# Patient Record
Sex: Female | Born: 1995 | Hispanic: Yes | Marital: Single | Smoking: Never smoker
Health system: Southern US, Community
[De-identification: ages and names within clinical notes are randomized; demographics above are authoritative.]

## PROBLEM LIST (undated history)

## (undated) DIAGNOSIS — E119 Type 2 diabetes mellitus without complications: Secondary | ICD-10-CM

## (undated) HISTORY — DX: Type 2 diabetes mellitus without complications: E11.9

## (undated) HISTORY — PX: FRACTURE SURGERY: SHX138

---
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
## 2014-05-30 ENCOUNTER — Inpatient Hospital Stay: Payer: Self-pay

## 2014-05-30 LAB — CBC WITH DIFFERENTIAL/PLATELET
BASOS ABS: 0 10*3/uL (ref 0.0–0.1)
BASOS PCT: 0.3 %
EOS PCT: 0.5 %
Eosinophil #: 0.1 10*3/uL (ref 0.0–0.7)
HCT: 31.7 % — ABNORMAL LOW (ref 35.0–47.0)
HGB: 10 g/dL — AB (ref 12.0–16.0)
LYMPHS ABS: 2.2 10*3/uL (ref 1.0–3.6)
Lymphocyte %: 16.1 %
MCH: 23.8 pg — AB (ref 26.0–34.0)
MCHC: 31.4 g/dL — ABNORMAL LOW (ref 32.0–36.0)
MCV: 76 fL — ABNORMAL LOW (ref 80–100)
MONO ABS: 0.7 x10 3/mm (ref 0.2–0.9)
Monocyte %: 5.2 %
Neutrophil #: 10.4 10*3/uL — ABNORMAL HIGH (ref 1.4–6.5)
Neutrophil %: 77.9 %
Platelet: 281 10*3/uL (ref 150–440)
RBC: 4.18 10*6/uL (ref 3.80–5.20)
RDW: 17.1 % — ABNORMAL HIGH (ref 11.5–14.5)
WBC: 13.4 10*3/uL — ABNORMAL HIGH (ref 3.6–11.0)

## 2014-05-31 LAB — GC/CHLAMYDIA PROBE AMP

## 2014-06-01 LAB — HEMATOCRIT: HCT: 28.7 % — AB (ref 35.0–47.0)

## 2014-09-09 NOTE — H&P (Signed)
L&D Evaluation:  History:  HPI 19 y/o G2P1001 @ 40/3wks EDC 05/27/14. Here with c/o strong contractions beginning this evening, denies leaking fluid or vaginal bleeding, baby is active. Care @ [facility] well pregnancy. GBS negative.   Presents with contractions   Patient's Medical History No Chronic Illness  anemia   Patient's Surgical History none   Medications Pre Natal Vitamins   Allergies NKDA   Social History none   Family History Non-Contributory   ROS:  ROS All systems were reviewed.  HEENT, CNS, GI, GU, Respiratory, CV, Renal and Musculoskeletal systems were found to be normal.   Exam:  Vital Signs stable   Urine Protein not completed   General no apparent distress   Mental Status clear   Chest clear   Heart normal sinus rhythm   Abdomen gravid, non-tender   Estimated Fetal Weight Average for gestational age   Fetal Position vtx   Fundal Height term   Back no CVAT   Edema no edema   Reflexes 1+   Clonus negative   Pelvic no external lesions, 8cm BBOW small show   Mebranes Intact   FHT normal rate with no decels, baseline 130's 140's avg variability with accels   Fetal Heart Rate 136   Ucx regular, Q 2 mins 60 sec strong   Skin dry   Lymph no lymphadenopathy   Impression:  Impression active labor   Plan:  Plan monitor contractions and for cervical change   Comments Admitted, knows what to expect 2nd baby. Plans natural childbirth, breathing through [facility].   Electronic Signatures: Albertina ParrLugiano, Franziska Podgurski B (CNM)  (Signed 29-Jan-16 22:19)  Authored: L&D Evaluation   Last Updated: 29-Jan-16 22:19 by Albertina ParrLugiano, Filimon Miranda B (CNM)

## 2017-07-07 LAB — HM HIV SCREENING LAB: HM HIV Screening: NEGATIVE

## 2017-07-07 LAB — HM PAP SMEAR: HM Pap smear: NEGATIVE

## 2017-11-12 ENCOUNTER — Emergency Department
Admission: EM | Admit: 2017-11-12 | Discharge: 2017-11-12 | Disposition: A | Discharge status: Home / Self Care | Payer: Self-pay | Attending: Emergency Medicine | Admitting: Emergency Medicine

## 2017-11-12 ENCOUNTER — Other Ambulatory Visit: Payer: Self-pay

## 2017-11-12 DIAGNOSIS — A6009 Herpesviral infection of other urogenital tract: Secondary | ICD-10-CM

## 2017-11-12 DIAGNOSIS — A6000 Herpesviral infection of urogenital system, unspecified: Secondary | ICD-10-CM | POA: Insufficient documentation

## 2017-11-12 DIAGNOSIS — N760 Acute vaginitis: Secondary | ICD-10-CM | POA: Insufficient documentation

## 2017-11-12 DIAGNOSIS — B9689 Other specified bacterial agents as the cause of diseases classified elsewhere: Secondary | ICD-10-CM

## 2017-11-12 DIAGNOSIS — E119 Type 2 diabetes mellitus without complications: Secondary | ICD-10-CM | POA: Insufficient documentation

## 2017-11-12 DIAGNOSIS — Z7984 Long term (current) use of oral hypoglycemic drugs: Secondary | ICD-10-CM | POA: Insufficient documentation

## 2017-11-12 LAB — WET PREP, GENITAL
Sperm: NONE SEEN
Trich, Wet Prep: NONE SEEN
Yeast Wet Prep HPF POC: NONE SEEN

## 2017-11-12 LAB — URINALYSIS, ROUTINE W REFLEX MICROSCOPIC
BACTERIA UA: NONE SEEN
BILIRUBIN URINE: NEGATIVE
Glucose, UA: 150 mg/dL — AB
Hgb urine dipstick: NEGATIVE
Ketones, ur: 5 mg/dL — AB
Nitrite: NEGATIVE
Protein, ur: 30 mg/dL — AB
SPECIFIC GRAVITY, URINE: 1.023 (ref 1.005–1.030)
pH: 5 (ref 5.0–8.0)

## 2017-11-12 LAB — CHLAMYDIA/NGC RT PCR (ARMC ONLY)
Chlamydia Tr: NOT DETECTED
N GONORRHOEAE: NOT DETECTED

## 2017-11-12 LAB — GLUCOSE, CAPILLARY: Glucose-Capillary: 235 mg/dL — ABNORMAL HIGH (ref 70–99)

## 2017-11-12 LAB — POCT PREGNANCY, URINE: PREG TEST UR: NEGATIVE

## 2017-11-12 MED ORDER — METFORMIN HCL 500 MG PO TABS: 500.0000 mg | ORAL_TABLET | Freq: Two times a day (BID) | ORAL | 1 refills | Status: AC

## 2017-11-12 MED ORDER — HYDROCODONE-ACETAMINOPHEN 7.5-325 MG/15ML PO SOLN: 15.0000 mL | Freq: Four times a day (QID) | ORAL | 0 refills | Status: AC | PRN

## 2017-11-12 MED ORDER — METRONIDAZOLE 500 MG PO TABS: 500.0000 mg | ORAL_TABLET | Freq: Two times a day (BID) | ORAL | 0 refills | Status: DC

## 2017-11-12 MED ORDER — ACYCLOVIR 400 MG PO TABS: 400.0000 mg | ORAL_TABLET | Freq: Every day | ORAL | 0 refills | Status: AC

## 2017-11-12 NOTE — Discharge Instructions (Signed)
Call 1 of the clinics listed on your discharge papers to set up an appointment for primary care.  Today's testing shows that you have diabetes type 2.  Begin taking metformin 500 mg twice a day.  1 refill is added to your prescription.  Also Flagyl 500 mg twice daily for 7 days for your bacterial vaginosis.  Hydrocodone is as needed for pain.  Take only as directed.  You need to begin Zovirax tablets 5 times per day for the next 10 days.  This medication is for your herpes.  May also follow-up with the health department for any further STD testing.  You also get a phone call today if your gonorrhea and Chlamydia test are positive. Take the yellow card with you when you get your prescriptions filled so that you may get a discount. Began a low sugar diet until you are able to see a primary care provider.

## 2017-11-12 NOTE — ED Notes (Signed)
See triage note  States she developed some vaginal itching on weds with some dysuria  No fever or n/v  Also states she noticed some sores in vaginal area

## 2017-11-12 NOTE — ED Notes (Signed)
First Nurse Note: pt c/o vaginal burning. Pt in NAD

## 2017-11-12 NOTE — ED Provider Notes (Signed)
[facility] Emergency Department Provider Note  ____________________________________________   First MD Initiated Contact with Patient 11/12/17 0801     (approximate)  I have reviewed the triage vital signs and the nursing notes.   HISTORY  Chief Complaint Dysuria and Vaginal Itching  HPI Jennifer Murillo is a 22 y.o. female is here with complaint of vaginal itching and burning for the last 4 days.  Patient complains of increased burning with urination.  She states she has noticed some sores in that area.  She is sexually active with one person and states that he is not having any symptoms.  She denies any previous infections.  She denies any vaginal discharge.  Patient states that there are family members with diabetes but she herself has never been told she had it.  Rates her pain as 9/10.  History reviewed. No pertinent past medical history.  There are no active problems to display for this patient.   Past Surgical History:  Procedure Laterality Date  . FRACTURE SURGERY      Prior to Admission medications   Medication Sig Start Date End Date Taking? Authorizing Provider  acyclovir (ZOVIRAX) 400 MG tablet Take 1 tablet (400 mg total) by mouth 5 (five) times daily for 10 days. 11/12/17 11/22/17  Tommi Rumps, PA-C  HYDROcodone-acetaminophen (HYCET) 7.5-325 mg/15 ml solution Take 15 mLs by mouth every 6 (six) hours as needed for severe pain. 11/12/17 11/12/18  Tommi Rumps, PA-C  metFORMIN (GLUCOPHAGE) 500 MG tablet Take 1 tablet (500 mg total) by mouth 2 (two) times daily with a meal. 11/12/17 11/12/18  Bridget Hartshorn L, PA-C  metroNIDAZOLE (FLAGYL) 500 MG tablet Take 1 tablet (500 mg total) by mouth 2 (two) times daily. 11/12/17   Tommi Rumps, PA-C    Allergies Patient has no known allergies.  History reviewed. No pertinent family history.  Social History Social History   Tobacco Use  . Smoking status: Never Smoker    Substance Use Topics  . Alcohol use: Never    Frequency: Never  . Drug use: Not on file    Review of Systems Constitutional: No fever/chills Cardiovascular: Denies chest pain. Respiratory: Denies shortness of breath. Gastrointestinal: No abdominal pain.  No nausea, no vomiting.   Genitourinary: Positive for dysuria Musculoskeletal: Negative for back pain. Skin: Positive for "sores" in the vaginal area. Neurological: Negative for headaches, focal weakness or numbness. ____________________________________________   PHYSICAL EXAM:  VITAL SIGNS: ED Triage Vitals  Enc Vitals Group     BP 11/12/17 0729 (!) 141/91     Pulse Rate 11/12/17 0729 89     Resp 11/12/17 0729 18     Temp 11/12/17 0729 99 F (37.2 C)     Temp Source 11/12/17 0729 Oral     SpO2 11/12/17 0729 98 %     Weight 11/12/17 0737 220 lb (99.8 kg)     Height 11/12/17 0737 5\' 2"  (1.575 m)     Head Circumference --      Peak Flow --      Pain Score 11/12/17 0736 9     Pain Loc --      Pain Edu? --      Excl. in GC? --    Constitutional: Alert and oriented. Well appearing and in no acute distress. Eyes: Conjunctivae are normal.  Head: Atraumatic. Neck: No stridor.   Cardiovascular: Normal rate, regular rhythm. Grossly normal heart sounds.  Good peripheral circulation. Respiratory: Normal respiratory effort.  No retractions. Lungs CTAB. Gastrointestinal: Soft and nontender. No distention. No CVA tenderness. Genitourinary: External genitalia has multiple vesicles present.  Some areas are open while others are still intact.  Areas are extremely tender to light touch.  Vaginal exam there is white secretions noted.  There is no adnexal masses or tenderness present.  Cervical motion tenderness is negative. Musculoskeletal: No lower extremity tenderness nor edema.   Neurologic:  Normal speech and language. No gross focal neurologic deficits are appreciated. No gait instability. Skin:  Skin is warm, dry.  As noted  above. Psychiatric: Mood and affect are normal. Speech and behavior are normal.  ____________________________________________   LABS (all labs ordered are listed, but only abnormal results are displayed)  Labs Reviewed  WET PREP, GENITAL - Abnormal; Notable for the following components:      Result Value   Clue Cells Wet Prep HPF POC PRESENT (*)    WBC, Wet Prep HPF POC RARE (*)    All other components within normal limits  URINALYSIS, ROUTINE W REFLEX MICROSCOPIC - Abnormal; Notable for the following components:   Color, Urine YELLOW (*)    APPearance CLOUDY (*)    Glucose, UA 150 (*)    Ketones, ur 5 (*)    Protein, ur 30 (*)    Leukocytes, UA MODERATE (*)    All other components within normal limits  GLUCOSE, CAPILLARY - Abnormal; Notable for the following components:   Glucose-Capillary 235 (*)    All other components within normal limits  CHLAMYDIA/NGC RT PCR ([facility] ONLY)  POC URINE PREG, ED  POCT PREGNANCY, URINE  CBG MONITORING, ED    PROCEDURES  Procedure(s) performed: None  Procedures  Critical Care performed: No  ____________________________________________   INITIAL IMPRESSION / ASSESSMENT AND PLAN / ED COURSE  As part of my medical decision making, I reviewed the following data within the electronic MEDICAL RECORD NUMBER Notes from prior ED visits and [place] Controlled Substance Database  Patient was made aware that her blood sugar was 235.  This is a fasting blood sugar.  Patient's mother is diabetic.  She was made aware that this may be contributing to her vaginal itching.  We also discussed bacterial vaginosis and patient was placed on Flagyl 500 mg twice daily for 7 days.  She was also given a prescription for Zovirax for her initial genital herpes.  Patient was given a prescription for Lortab to be taken as needed for pain.  She was given a prescription for metformin 500 mg twice daily.  She plans to [facility] since this  is where her mother's doctor is.  She was also given other options [facility].  We discussed diet changes prior to her discharge.  ____________________________________________   FINAL CLINICAL IMPRESSION(S) / ED DIAGNOSES  Final diagnoses:  Bacterial vaginosis  Herpes simplex of female genitalia  New onset type 2 diabetes mellitus [facility](HCC)     ED Discharge Orders        Ordered    metroNIDAZOLE (FLAGYL) 500 MG tablet  2 times daily     11/12/17 1038    acyclovir (ZOVIRAX) 400 MG tablet  5 times daily     11/12/17 1038    metFORMIN (GLUCOPHAGE) 500 MG tablet  2 times daily with meals     11/12/17 1038    HYDROcodone-acetaminophen (HYCET) 7.5-325 mg/15 ml solution  Every 6 hours PRN     11/12/17 1038  Note:  This document was prepared using Dragon voice recognition software and may include unintentional dictation errors.    Tommi Rumps, PA-C 11/12/17 1710    Jeanmarie Plant, MD 11/17/17 [phone]

## 2017-11-12 NOTE — ED Triage Notes (Signed)
Pt arrives to ED c/o of burning and itching "in vaginal area." states when she urinates and "when I sit down." symptoms x 4 days. Denies vaginal discharge. States she has noticed sores. Denies fevers. Alert, oriented, ambulatory. Denies sexual activity.

## 2019-07-19 ENCOUNTER — Ambulatory Visit: Payer: Self-pay

## 2020-02-17 ENCOUNTER — Ambulatory Visit: Payer: Self-pay

## 2020-02-17 ENCOUNTER — Ambulatory Visit (LOCAL_COMMUNITY_HEALTH_CENTER): Payer: Self-pay | Admitting: Family Medicine

## 2020-02-17 ENCOUNTER — Other Ambulatory Visit: Payer: Self-pay

## 2020-02-17 ENCOUNTER — Encounter: Payer: Self-pay | Admitting: Family Medicine

## 2020-02-17 DIAGNOSIS — R030 Elevated blood-pressure reading, without diagnosis of hypertension: Secondary | ICD-10-CM | POA: Insufficient documentation

## 2020-02-17 DIAGNOSIS — Z01419 Encounter for gynecological examination (general) (routine) without abnormal findings: Secondary | ICD-10-CM

## 2020-02-17 DIAGNOSIS — Z3046 Encounter for surveillance of implantable subdermal contraceptive: Secondary | ICD-10-CM

## 2020-02-17 DIAGNOSIS — E119 Type 2 diabetes mellitus without complications: Secondary | ICD-10-CM | POA: Insufficient documentation

## 2020-02-17 DIAGNOSIS — Z3009 Encounter for other general counseling and advice on contraception: Secondary | ICD-10-CM

## 2020-02-17 LAB — HM HIV SCREENING LAB: HM HIV Screening: NEGATIVE

## 2020-02-17 LAB — HM HEPATITIS C SCREENING LAB: HM Hepatitis Screen: NEGATIVE

## 2020-02-17 LAB — WET PREP FOR TRICH, YEAST, CLUE
Trichomonas Exam: NEGATIVE
Yeast Exam: NEGATIVE

## 2020-02-17 NOTE — Progress Notes (Signed)
[facility] [address] [place], [place] [address] Main Number: [phone]  Family Planning Visit- Initial Visit  Subjective:  Jennifer Murillo is a 24 y.o.  [phone]  being seen today for an initial well woman visit and to discuss family planning options. Patient reports they do want a pregnancy in the next year.   Chief Complaint  Patient presents with  . Annual Exam    nexplanon removal    Pt has Diabetes type 2, controlled (HCC) and Elevated blood pressure reading on their problem list.   HPI  Patient reports she is here for physical exam and Nexplanon removal. Nexplanon placed 06/2017. She is hoping for pregnancy in next year.  BP elevated today, no hx HTN, states she is feeling well.   No LMP recorded. Last sex: yesterday BCM: NExplanon  Pt desires EC? Pt declines  Last pap per pt/review of record: 06/2017, result negative. Next due 06/2020. Last HIV test per pt/review of record: 06/2017 Last tetanus vaccine: 2016 Covid vaccine: pt has recieved  Last breast exam: 2019 Personal/family hx breast cancer? no  Patient reports 1 partner(s) in last year. Do they desire STI screening (if no, why not)? yes  Does the patient desire a pregnancy in the next year? yes   24 y.o., There is no height or weight on file to calculate BMI. - Is patient eligible for HA1C diabetes screening based on BMI and age >68?  no  HCV screening;       Has patient been screened once for HCV in the past?  no  No results found for: HCVAB      Does the patient have current drug use, have a partner with drug use, and/or has been incarcerated since last result? no If yes-- Screen for HCV through [facility]   Does the patient meet criteria for HBV testing? no Criteria:  -Household, sexual or needle sharing contact with HBV -History of drug use -HIV positive -Those with known Hep C  PHQ-2 score 0  See flowsheet for other program  required questions.   Health Maintenance Due  Topic Date Due  . HEMOGLOBIN A1C  Never done  . Hepatitis C Screening  Never done  . PNEUMOCOCCAL POLYSACCHARIDE VACCINE AGE 12-64 HIGH RISK  Never done  . FOOT EXAM  Never done  . OPHTHALMOLOGY EXAM  Never done  . URINE MICROALBUMIN  Never done  . COVID-19 Vaccine (1) Never done  . TETANUS/TDAP  Never done  . INFLUENZA VACCINE  Never done    ROS 10 point review of systems is otherwise negative except as mentioned in HPI and listed below: none  The following portions of the patient's history were reviewed and updated as appropriate: allergies, current medications, past family history, past medical history, past social history, past surgical history and problem list. Problem list updated.   See flowsheet for other program required questions.  Objective:  There were no vitals filed for this visit.  Physical Exam Vitals and nursing note reviewed.  Constitutional:      Appearance: Normal appearance.  HENT:     Head: Normocephalic and atraumatic.     Mouth/Throat:     Mouth: Mucous membranes are moist.     Pharynx: Oropharynx is clear. No oropharyngeal exudate or posterior oropharyngeal erythema.  Eyes:     Conjunctiva/sclera: Conjunctivae normal.  Neck:     Thyroid: No thyroid mass, thyromegaly or thyroid tenderness.  Cardiovascular:     Rate  and Rhythm: Normal rate and regular rhythm.     Pulses: Normal pulses.     Heart sounds: Normal heart sounds.  Pulmonary:     Effort: Pulmonary effort is normal.     Breath sounds: Normal breath sounds.  Abdominal:     General: Abdomen is flat.     Palpations: There is no mass.     Tenderness: There is no abdominal tenderness. There is no rebound.  Genitourinary:    General: Normal vulva.     Exam position: Lithotomy position.     Pubic Area: No rash or pubic lice.      Labia:        Right: No rash or lesion.        Left: No rash or lesion.      Vagina: Normal. No vaginal  erythema, bleeding or lesions. Vaginal discharge: white, ph>4.5    Cervix: No cervical motion tenderness, discharge, friability, lesion or erythema.     Uterus: Normal.      Adnexa: Right adnexa normal and left adnexa normal.     Rectum: Normal.  Lymphadenopathy:     Head:     Right side of head: No preauricular or posterior auricular adenopathy.     Left side of head: No preauricular or posterior auricular adenopathy.     Cervical: No cervical adenopathy.     Upper Body:     Right upper body: No supraclavicular or axillary adenopathy.     Left upper body: No supraclavicular or axillary adenopathy.     Lower Body: No right inguinal adenopathy. No left inguinal adenopathy.  Skin:    General: Skin is warm and dry.     Findings: No rash.  Neurological:     Mental Status: She is alert and oriented to person, place, and time.   Nexplanon Removal Patient identified, informed consent performed, consent signed.   Appropriate time out taken. Nexplanon site identified.  Area prepped in usual sterile fashon. 3 ml of 1% lidocaine with Epinephrine was used to anesthetize the area at the distal end of the implant and along implant site. A small stab incision was made right beside the implant on the distal portion.  The Nexplanon rod was grasped using hemostats and removed without difficulty.  There was minimal blood loss. There were no complications.  Steri-strips were applied over the small incision.  A pressure bandage was applied to reduce any bruising.  The patient tolerated the procedure well and was given post procedure instructions.      Assessment and Plan:  Jennifer Murillo is a 24 y.o. female presenting to the [facility] Department for an initial well woman exam/family planning visit.  Contraception counseling: Reviewed all forms of birth control options in the tiered based approach. available including abstinence; over the counter/barrier methods; hormonal contraceptive  medication including pill, patch, ring, injection,contraceptive implant, ECP; hormonal and nonhormonal IUDs; permanent sterilization options including vasectomy and the various tubal sterilization modalities. Risks, benefits, and typical effectiveness rates were reviewed.  Questions were answered.  Written information was also given to the patient to review.  Patient desires nothing for [facility]. She will follow up in  1 year for surveillance.  She was told to call with any further questions, or with any concerns about this method of contraception.  Emphasized use of condoms 100% of the time for STI prevention.  Emergency Contraception: Pt was offered ECP. ECP was declined by pt.  1. Well woman exam -BCM: nexplanon removed.  Pt desires pregnancy. -Pap: due 06/2020 -CBE: due 2022. "Active FYIs" info up to date. Recommended screening mammograms beginning at age 76 -STI screening: done today per pt request, denies symptoms -Hepatitis B/C screening: pt qualifies and accepts hep c screen -Recommended PCP f/u for DM - WET PREP FOR TRICH, YEAST, CLUE - Chlamydia/Gonorrhea [facility] - HIV/HCV [place] [facility] - Syphilis Serology, [facility]  2. Nexplanon removal -Nexplanon removed today.  -Pt now desires nothing for birth control method, desires pregnancy. Preconception counseling given - advised taking PNV w/folic acid, encouraged healthy diet and lifestyle. -Last sex w/out condom yesterday. As pt had recent sex w/out condom she is at risk of pregnancy w/ Nexplanon removal today. She states understanding and agrees to procedure anyway, declines ECP today. Advised to take home pregnancy test in 2 wks.  3. Elevated blood pressure reading -BP elevated today. Pt states no hx of HTN diagnosis. Advised to recheck in community in 1-2 days, if continues >140/90 to see PCP for further investigation. Pt is in agreement with plan.   Return in about 1 year (around 02/16/2021) for yearly wellness exam.  No future  appointments.  Ann Held, PA-C

## 2020-02-17 NOTE — Progress Notes (Signed)
Presents for PE and IUD removal. IUD removal consent signed, instructions given & counseled. Results reviewed with provider, no treatment indicated. Sharlyne Pacas, RN

## 2020-05-11 ENCOUNTER — Emergency Department: Payer: Self-pay

## 2020-05-11 ENCOUNTER — Other Ambulatory Visit: Payer: Self-pay

## 2020-05-11 DIAGNOSIS — Z7984 Long term (current) use of oral hypoglycemic drugs: Secondary | ICD-10-CM | POA: Insufficient documentation

## 2020-05-11 DIAGNOSIS — O4691 Antepartum hemorrhage, unspecified, first trimester: Secondary | ICD-10-CM | POA: Insufficient documentation

## 2020-05-11 DIAGNOSIS — O24111 Pre-existing diabetes mellitus, type 2, in pregnancy, first trimester: Secondary | ICD-10-CM | POA: Insufficient documentation

## 2020-05-11 DIAGNOSIS — N939 Abnormal uterine and vaginal bleeding, unspecified: Secondary | ICD-10-CM

## 2020-05-11 DIAGNOSIS — O30041 Twin pregnancy, dichorionic/diamniotic, first trimester: Secondary | ICD-10-CM | POA: Insufficient documentation

## 2020-05-11 DIAGNOSIS — Z3A11 11 weeks gestation of pregnancy: Secondary | ICD-10-CM | POA: Insufficient documentation

## 2020-05-11 LAB — CBC
HCT: 34.1 % — ABNORMAL LOW (ref 36.0–46.0)
Hemoglobin: 11.5 g/dL — ABNORMAL LOW (ref 12.0–15.0)
MCH: 28.7 pg (ref 26.0–34.0)
MCHC: 33.7 g/dL (ref 30.0–36.0)
MCV: 85 fL (ref 80.0–100.0)
Platelets: 298 10*3/uL (ref 150–400)
RBC: 4.01 MIL/uL (ref 3.87–5.11)
RDW: 12.2 % (ref 11.5–15.5)
WBC: 12.5 10*3/uL — ABNORMAL HIGH (ref 4.0–10.5)
nRBC: 0 % (ref 0.0–0.2)

## 2020-05-11 LAB — COMPREHENSIVE METABOLIC PANEL
ALT: 10 U/L (ref 0–44)
AST: 13 U/L — ABNORMAL LOW (ref 15–41)
Albumin: 3.3 g/dL — ABNORMAL LOW (ref 3.5–5.0)
Alkaline Phosphatase: 36 U/L — ABNORMAL LOW (ref 38–126)
Anion gap: 11 (ref 5–15)
BUN: 9 mg/dL (ref 6–20)
CO2: 23 mmol/L (ref 22–32)
Calcium: 9 mg/dL (ref 8.9–10.3)
Chloride: 103 mmol/L (ref 98–111)
Creatinine, Ser: 0.7 mg/dL (ref 0.44–1.00)
GFR, Estimated: >60 mL/min (ref >60)
Glucose, Bld: 152 mg/dL — ABNORMAL HIGH (ref 70–99)
Potassium: 3.3 mmol/L — ABNORMAL LOW (ref 3.5–5.1)
Sodium: 137 mmol/L (ref 135–145)
Total Bilirubin: 0.4 mg/dL (ref 0.3–1.2)
Total Protein: 7.2 g/dL (ref 6.5–8.1)

## 2020-05-11 LAB — URINALYSIS, COMPLETE (UACMP) WITH MICROSCOPIC
Bacteria, UA: NONE SEEN
Bilirubin Urine: NEGATIVE
Glucose, UA: 50 mg/dL — AB
Ketones, ur: NEGATIVE mg/dL
Nitrite: NEGATIVE
Protein, ur: 30 mg/dL — AB
RBC / HPF: >50 RBC/hpf — ABNORMAL HIGH (ref 0–5)
Specific Gravity, Urine: 1.021 (ref 1.005–1.030)
pH: 6 (ref 5.0–8.0)

## 2020-05-11 LAB — HCG, QUANTITATIVE, PREGNANCY: hCG, Beta Chain, Quant, S: 166825 m[IU]/mL — ABNORMAL HIGH (ref <5)

## 2020-05-11 LAB — POC URINE PREG, ED: Preg Test, Ur: POSITIVE — AB

## 2020-05-11 LAB — ABO/RH: ABO/RH(D): O POS

## 2020-05-11 NOTE — ED Triage Notes (Signed)
Pt in with co vaginal spotting that started today with low back pain. Pt G3p2 has not seen obgyn yet.

## 2020-05-12 ENCOUNTER — Emergency Department
Admission: EM | Admit: 2020-05-12 | Discharge: 2020-05-12 | Disposition: A | Discharge status: Home / Self Care | Payer: Self-pay | Attending: Emergency Medicine | Admitting: Emergency Medicine

## 2020-05-12 ENCOUNTER — Encounter: Payer: Self-pay | Admitting: Radiology

## 2020-05-12 DIAGNOSIS — O468X1 Other antepartum hemorrhage, first trimester: Secondary | ICD-10-CM

## 2020-05-12 DIAGNOSIS — O418X10 Other specified disorders of amniotic fluid and membranes, first trimester, not applicable or unspecified: Secondary | ICD-10-CM

## 2020-05-12 DIAGNOSIS — N939 Abnormal uterine and vaginal bleeding, unspecified: Secondary | ICD-10-CM

## 2020-05-12 DIAGNOSIS — O30041 Twin pregnancy, dichorionic/diamniotic, first trimester: Secondary | ICD-10-CM

## 2020-05-12 DIAGNOSIS — O200 Threatened abortion: Secondary | ICD-10-CM

## 2020-05-12 NOTE — ED Notes (Signed)
Patient discharged to home per MD order. Patient in stable condition, and deemed medically cleared by ED provider for discharge. Discharge instructions reviewed with patient/family using "Teach Back"; verbalized understanding of medication education and administration, and information about follow-up care. Denies further concerns. ° °

## 2020-05-12 NOTE — ED Provider Notes (Signed)
[facility] Emergency Department Provider Note  ____________________________________________  Time seen: Approximately 1:38 AM  I have reviewed the triage vital signs and the nursing notes.   HISTORY  Chief Complaint Vaginal Bleeding   HPI Jennifer Murillo is a 25 y.o. female G2P3 currently at 11w GA per LMP who presents for evaluation of vaginal bleeding.  Patient has not established care for this pregnancy yet.  Started having spotting this evening which has now resolved.  No abdominal pain.  Initially had some cramping lower back pain  which has now resolved.  Denies any vaginal discharge or history of STD.  Denies dysuria or hematuria, fever or chills, syncope, chest pain or shortness of breath.  Patient has 2 prior full-term pregnancies with no complication.  Past Medical History:  Diagnosis Date  . Diabetes mellitus without complication [facility])     Patient Active Problem List   Diagnosis Date Noted  . Diabetes type 2, controlled (HCC) 02/17/2020  . Elevated blood pressure reading 02/17/2020    Past Surgical History:  Procedure Laterality Date  . FRACTURE SURGERY      Prior to Admission medications   Medication Sig Start Date End Date Taking? Authorizing Provider  metFORMIN (GLUCOPHAGE) 500 MG tablet Take 1 tablet (500 mg total) by mouth 2 (two) times daily with a meal. 11/12/17 11/12/18  Tommi Rumps, PA-C    Allergies Patient has no known allergies.  Family History  Problem Relation Age of Onset  . Hypertension Mother   . Diabetes Mother   . Diabetes Father   . Cancer Father        blood  . Heart defect Sister   . Breast cancer Neg Hx     Social History Social History   Tobacco Use  . Smoking status: Never Smoker  . Smokeless tobacco: Never Used  Vaping Use  . Vaping Use: Never used  Substance Use Topics  . Alcohol use: Never  . Drug use: Never    Review of Systems  Constitutional: Negative for  fever. Eyes: Negative for visual changes. ENT: Negative for sore throat. Neck: No neck pain  Cardiovascular: Negative for chest pain. Respiratory: Negative for shortness of breath. Gastrointestinal: Negative for abdominal pain, vomiting or diarrhea. Genitourinary: Negative for dysuria. + vaginal bleeding Musculoskeletal: Negative for back pain. Skin: Negative for rash. Neurological: Negative for headaches, weakness or numbness. Psych: No SI or HI  ____________________________________________   PHYSICAL EXAM:  VITAL SIGNS: Vitals:   05/11/20 2129 05/12/20 0035  BP: (!) 147/84 114/73  Pulse: (!) 106 87  Resp: 20 20  Temp: 98.9 F (37.2 C) 98.7 F (37.1 C)  SpO2: 99% 99%    Constitutional: Alert and oriented. Well appearing and in no apparent distress. HEENT:      Head: Normocephalic and atraumatic.         Eyes: Conjunctivae are normal. Sclera is non-icteric.       Mouth/Throat: Mucous membranes are moist.       Neck: Supple with no signs of meningismus. Cardiovascular: Regular rate and rhythm. No murmurs, gallops, or rubs.  Respiratory: Normal respiratory effort. Lungs are clear to auscultation bilaterally.  Gastrointestinal: Soft, non tender, and non distended with positive bowel sounds. No rebound or guarding. Musculoskeletal: No edema, cyanosis, or erythema of extremities. Neurologic: Normal speech and language. Face is symmetric. Moving all extremities. No gross focal neurologic deficits are appreciated. Skin: Skin is warm, dry and intact. No rash noted. Psychiatric: Mood and affect are  normal. Speech and behavior are normal.  ____________________________________________   LABS (all labs ordered are listed, but only abnormal results are displayed)  Labs Reviewed  CBC - Abnormal; Notable for the following components:      Result Value   WBC 12.5 (*)    Hemoglobin 11.5 (*)    HCT 34.1 (*)    All other components within normal limits  COMPREHENSIVE METABOLIC  PANEL - Abnormal; Notable for the following components:   Potassium 3.3 (*)    Glucose, Bld 152 (*)    Albumin 3.3 (*)    AST 13 (*)    Alkaline Phosphatase 36 (*)    All other components within normal limits  HCG, QUANTITATIVE, PREGNANCY - Abnormal; Notable for the following components:   hCG, Beta Francene Finders [phone] (*)    All other components within normal limits  URINALYSIS, COMPLETE (UACMP) WITH MICROSCOPIC - Abnormal; Notable for the following components:   Color, Urine YELLOW (*)    APPearance HAZY (*)    Glucose, UA 50 (*)    Hgb urine dipstick LARGE (*)    Protein, ur 30 (*)    Leukocytes,Ua TRACE (*)    RBC / HPF >50 (*)    All other components within normal limits  POC URINE PREG, ED - Abnormal; Notable for the following components:   Preg Test, Ur Positive (*)    All other components within normal limits  ABO/RH   ____________________________________________  EKG  none  ____________________________________________  RADIOLOGY  I have personally reviewed the images performed during this visit and I agree with the Radiologist's read.   Interpretation by Radiologist:  US OB Comp Less 14 Wks  Result Date: 05/12/2020 CLINICAL DATA:  Vaginal spotting EXAM: TWIN OBSTETRICAL ULTRASOUND <14 WKS COMPARISON:  None. FINDINGS: Number of IUPs:  2 Chorionicity/Amnionicity:  Dichorionic-diamniotic (thick membrane) TWIN 1 Yolk sac:  Not visualized Embryo:  Visualized Cardiac Activity: Visualized Heart Rate: 173 bpm MSD:   mm    w     d CRL:   50 mm   11 w 5 d                  [place] EDC: 11/25/2020 TWIN 2 Yolk sac:  None Embryo:  Visualized Cardiac Activity: Visualized Heart Rate: 173 bpm MSD:   Mm    w     d CRL:   50 mm   11 w 5 d                  [place] EDC: 11/25/2020 Subchorionic hemorrhage:  Small subchorionic hemorrhage Maternal uterus/adnexae: 7 cm cyst within the right ovary. IMPRESSION: Twin intrauterine pregnancy, 11 weeks 5 days. Small subchorionic hemorrhage. Electronically  Signed   By: Charlett Nose M.D.   On: 05/12/2020 00:28   US OB Comp AddL Gest Less 14 Wks  Result Date: 05/12/2020 CLINICAL DATA:  Vaginal spotting EXAM: TWIN OBSTETRICAL ULTRASOUND <14 WKS COMPARISON:  None. FINDINGS: Number of IUPs:  2 Chorionicity/Amnionicity:  Dichorionic-diamniotic (thick membrane) TWIN 1 Yolk sac:  Not visualized Embryo:  Visualized Cardiac Activity: Visualized Heart Rate: 173 bpm MSD:   mm    w     d CRL:   50 mm   11 w 5 d                  [place] EDC: 11/25/2020 TWIN 2 Yolk sac:  None Embryo:  Visualized Cardiac Activity: Visualized Heart Rate: 173 bpm MSD:   Mm  w     d CRL:   50 mm   11 w 5 d                  [place] EDC: 11/25/2020 Subchorionic hemorrhage:  Small subchorionic hemorrhage Maternal uterus/adnexae: 7 cm cyst within the right ovary. IMPRESSION: Twin intrauterine pregnancy, 11 weeks 5 days. Small subchorionic hemorrhage. Electronically Signed   By: Charlett Nose M.D.   On: 05/12/2020 00:28     ____________________________________________   PROCEDURES  Procedure(s) performed: None Procedures Critical Care performed:  None ____________________________________________   INITIAL IMPRESSION / ASSESSMENT AND PLAN / ED COURSE  25 y.o. female G2P3 currently at 11w GA per LMP who presents for evaluation of vaginal bleeding which has now subsided.  Patient is hemodynamically stable.  Mild anemia with hemoglobin of 11.5.  hCG of 166,000.  UA with no evidence of UTI.  Blood type O+ with no indication for RhoGAM.  Transvaginal ultrasound showing twin pregnancy with a small subchorionic hemorrhage.  Discussed this findings with patient who has an appointment with her OB/GYN tomorrow for establishment of care.  Recommended STD testing but patient will defer that to her OB visit at this time.  Discussed pelvic rest, signs of acute blood loss anemia and recommended return to the emergency room if these develop.  Old medical records reviewed.       _____________________________________________ Please note:  Patient was evaluated in Emergency Department today for the symptoms described in the history of present illness. Patient was evaluated in the context of the global COVID-19 pandemic, which necessitated consideration that the patient might be at risk for infection with the SARS-CoV-2 virus that causes COVID-19. Institutional protocols and algorithms that pertain to the evaluation of patients at risk for COVID-19 are in a state of rapid change based on information released by regulatory bodies including the CDC and federal and state organizations. These policies and algorithms were followed during the patient's care in the ED.  Some ED evaluations and interventions may be delayed as a result of limited staffing during the pandemic.   [place] Controlled Substance Database was reviewed by me. ____________________________________________   FINAL CLINICAL IMPRESSION(S) / ED DIAGNOSES   Final diagnoses:  Subchorionic hemorrhage of placenta in first trimester, single or unspecified fetus  Dichorionic diamniotic twin pregnancy in first trimester  Threatened miscarriage      NEW MEDICATIONS STARTED DURING THIS VISIT:  ED Discharge Orders    None       Note:  This document was prepared using Dragon voice recognition software and may include unintentional dictation errors.    Don Perking, [place], MD 05/12/20 [phone]

## 2020-05-12 NOTE — Discharge Instructions (Signed)
As we discussed, your ultrasound showed a twin pregnancy with a small subchorionic hemorrhage.  Make sure to do pelvic rest as explained for 48 hours of no bleeding.  Make sure to follow-up with OB this week.  Continue to take your prenatal vitamins.  Return to the emergency room if your bleeding is severe to the point they feel like you are going to pass out.

## 2020-12-17 ENCOUNTER — Encounter: Payer: Self-pay | Admitting: Physician Assistant

## 2020-12-17 ENCOUNTER — Other Ambulatory Visit: Payer: Self-pay

## 2020-12-17 ENCOUNTER — Ambulatory Visit (LOCAL_COMMUNITY_HEALTH_CENTER): Payer: Self-pay | Admitting: Physician Assistant

## 2020-12-17 VITALS — BP 126/80 | Ht 62.0 in | Wt 212.8 lb

## 2020-12-17 DIAGNOSIS — Z30017 Encounter for initial prescription of implantable subdermal contraceptive: Secondary | ICD-10-CM

## 2020-12-17 DIAGNOSIS — Z3009 Encounter for other general counseling and advice on contraception: Secondary | ICD-10-CM

## 2020-12-17 MED ORDER — ETONOGESTREL 68 MG ~~LOC~~ IMPL
68.0000 mg | DRUG_IMPLANT | Freq: Once | SUBCUTANEOUS | Status: AC
  Administered 2020-12-17: 68 mg via SUBCUTANEOUS

## 2020-12-17 NOTE — Progress Notes (Signed)
Pt here for Nexplanon insertion.  Pt's last sex was 12/16/2020 with a condom.

## 2020-12-18 NOTE — Progress Notes (Signed)
[facility]  Subjective:  Jennifer Murillo is a 25 y.o. being seen today for Nexplanon insertion.  Chief Complaint  Patient presents with   Contraception    Nexplanon insertion    HPI Patient reports that she delivered7/12/2020, twins and has been doing well after delivery.  States that she was previously both breast an bottle feeding but is now only using formula.  Reports that she has not had a regular period since delivery and had a PP visit at [facility] on 12/11/2020.  States last sex was 12/16/2020 with condoms.  Does the patient have a current or past history of drug use? No   No components found for: HCV]   Health Maintenance Due  Topic Date Due   HEMOGLOBIN A1C  Never done   PNEUMOCOCCAL POLYSACCHARIDE VACCINE AGE 72-64 HIGH RISK  Never done   COVID-19 Vaccine (1) Never done   FOOT EXAM  Never done   OPHTHALMOLOGY EXAM  Never done   URINE MICROALBUMIN  Never done   HPV VACCINES (1 - 2-dose series) Never done   INFLUENZA VACCINE  11/30/2020    Review of Systems  All other systems reviewed and are negative.  The following portions of the patient's history were reviewed and updated as appropriate: allergies, current medications, past family history, past medical history, past social history, past surgical history and problem list. Problem list updated.   See flowsheet for other program required questions.  Objective:   Vitals:   12/17/20 0929 12/17/20 0944  BP: (!) 145/89 126/80  Weight: 212 lb 12.8 oz (96.5 kg)   Height: 5\' 2"  (1.575 m)     Physical Exam Vitals and nursing note reviewed.  Constitutional:      General: She is not in acute distress.    Appearance: Normal appearance. She is obese.  HENT:     Head: Normocephalic and atraumatic.  Eyes:     Conjunctiva/sclera: Conjunctivae normal.  Pulmonary:     Effort: Pulmonary effort is normal.  Skin:    General: Skin is warm and dry.   Neurological:     Mental Status: She is alert and oriented to person, place, and time.  Psychiatric:        Mood and Affect: Mood normal.        Behavior: Behavior normal.        Thought Content: Thought content normal.        Judgment: Judgment normal.      Assessment and Plan:  Jennifer Murillo is a 25 y.o. female presenting to the [facility] Department for a Women's Health problem visit  1. Encounter for counseling regarding contraception Reviewed with patient normal SE of Nexplanon and when to call with questions or concerns. Stressed to patient importance of using back up such as condoms for 10 days after insertion and checking an OTC pregnancy test in 2 weeks. If pregnancy test in 2 weeks is positive, patient should call and RTC. Counseled patient that likelihood of pregnancy is low if has only had sex the one time, no contact prior to condom placement and condom did not break as well as that she is so soon postpartum ( < 6 weeks) and had negative pregnancy test 12/11/2020 at her PP visit.  2. Nexplanon insertion Nexplanon Insertion Procedure Patient identified, informed consent performed, consent signed.   Patient does understand that irregular bleeding is a very common side effect of this medication. She  was advised to have backup contraception after placement. Patient was determined to meet WHO criteria for not being pregnant. Appropriate time out taken.  The insertion site was identified 8-10 cm (3-4 inches) from the medial epicondyle of the humerus and 3-5 cm (1.25-2 inches) posterior to (below) the sulcus (groove) between the biceps and triceps muscles of the patient's left arm and marked.  Patient was prepped with alcohol swab and then injected with 3 ml of 1% lidocaine.  Arm was prepped with chlorhexidene, Nexplanon removed from packaging,  Device confirmed in needle, then inserted full length of needle and withdrawn per handbook instructions. Nexplanon was  able to palpated in the patient's arm; patient palpated the insert herself. There was minimal blood loss.  Patient insertion site covered with guaze and a pressure bandage to reduce any bruising.  The patient tolerated the procedure well and was given post procedure instructions.    Counseled patient to take OTC analgesic starting as soon as lidocaine starts to wear off and take regularly for at least 48 hr to decrease discomfort.  Specifically to take with food or milk to decrease stomach upset and for IB 600 mg (3 tablets) every 6 hrs; IB 800 mg (4 tablets) every 8 hrs; or Aleve 2 tablets every 12 hrs.   - etonogestrel (NEXPLANON) implant 68 mg     No follow-ups on file.  No future appointments.  Matt Holmes, PA

## 2021-09-10 IMAGING — US US OB COMP LESS 14 WK
1 series · 14 of 28 positions shown · non-contrast
Comparison: None.

CLINICAL DATA: Vaginal spotting

EXAM:
TWIN OBSTETRICAL ULTRASOUND <14 WKS

[Series 1: ob us · 14 of 75 slices shown]
[im 3/75]
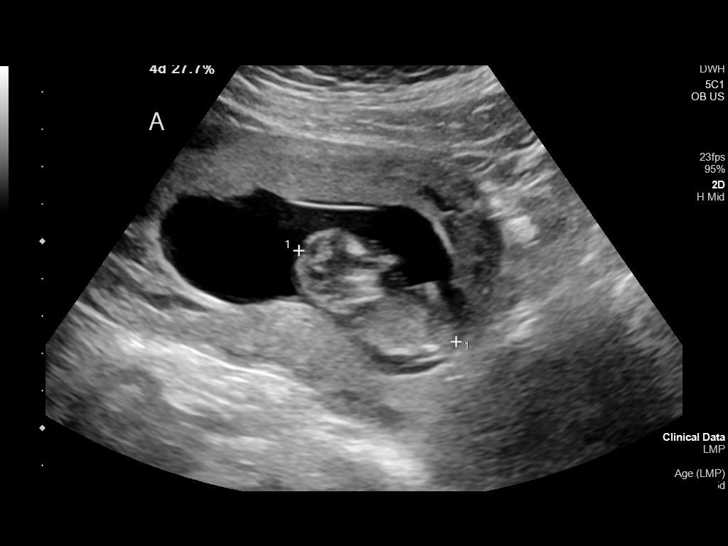
[im 9/75]
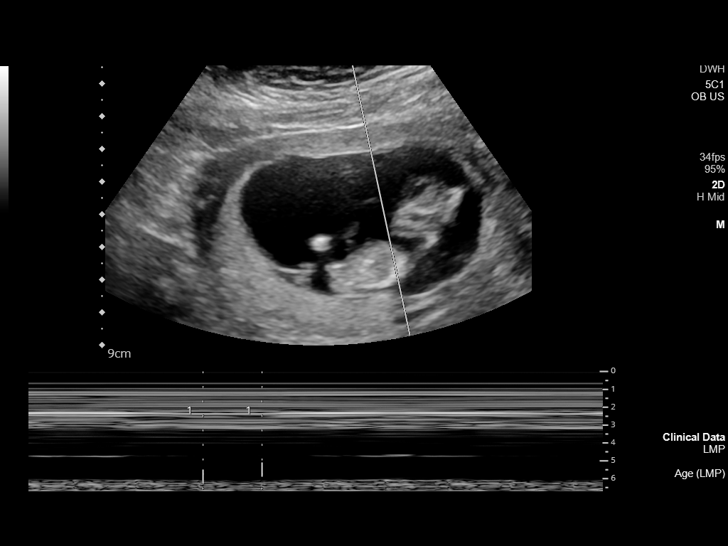
[im 14/75]
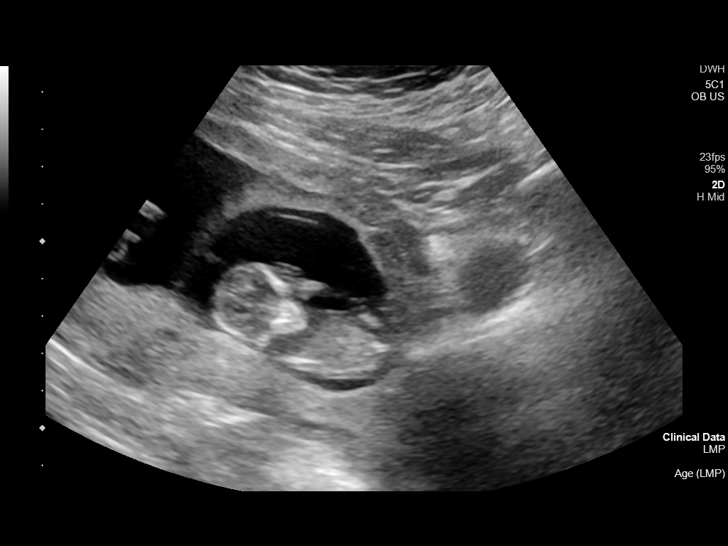
[im 20/75]
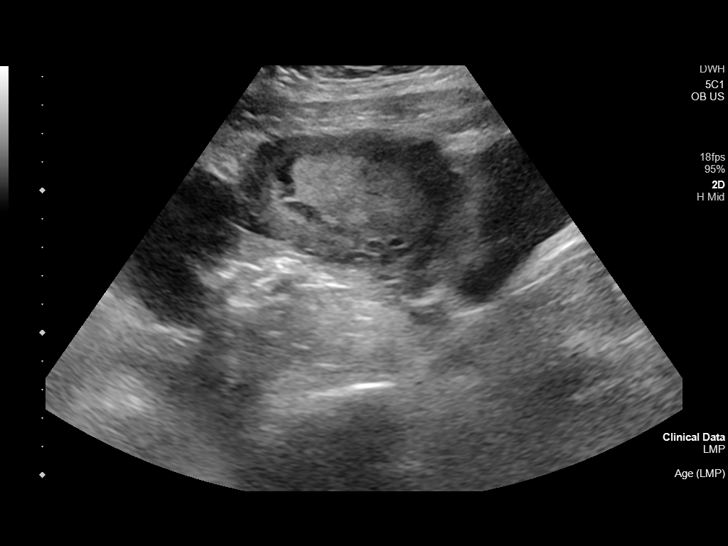
[im 25/75]
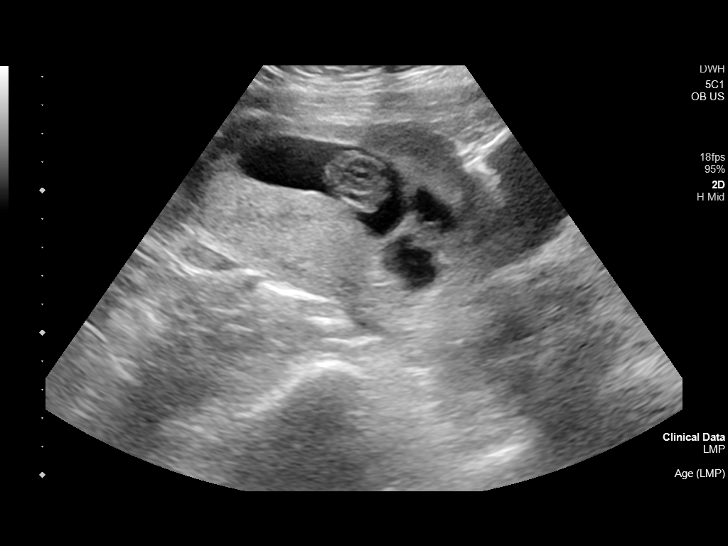
[im 31/75]
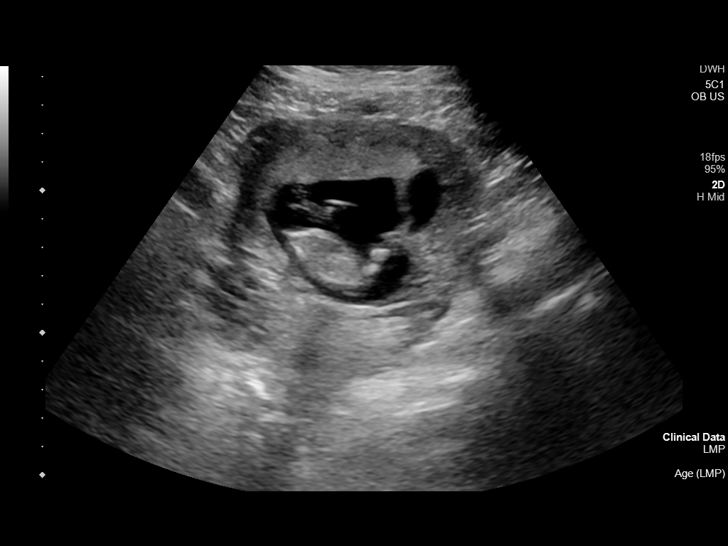
[im 36/75]
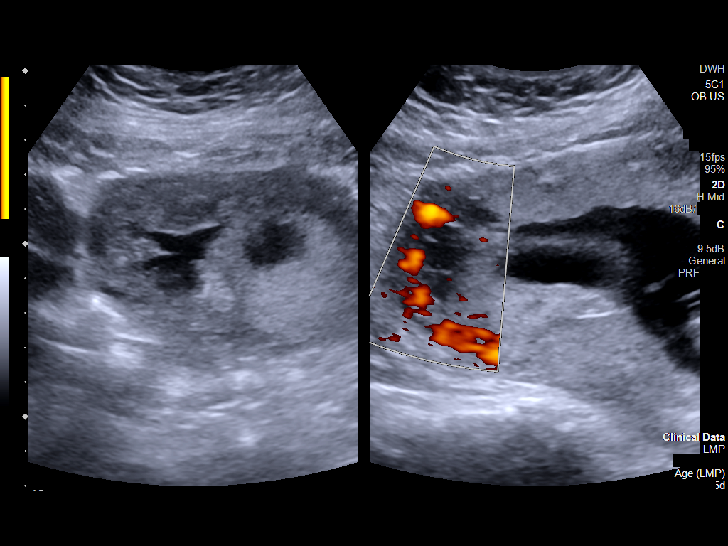
[im 42/75]
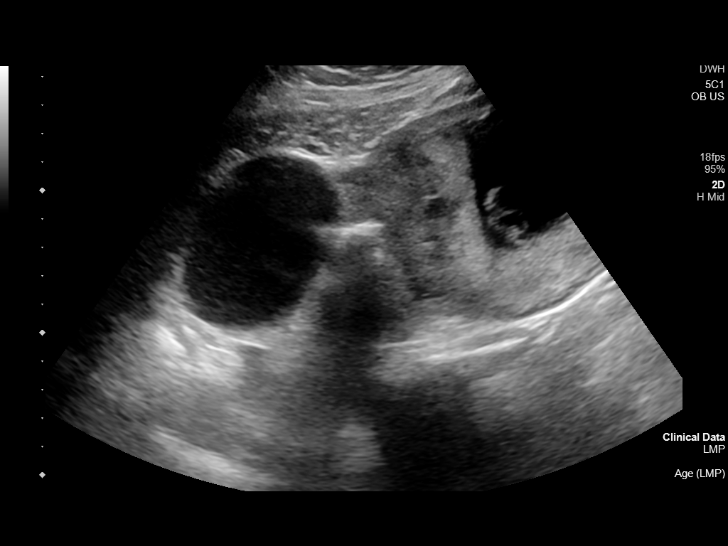
[im 47/75]
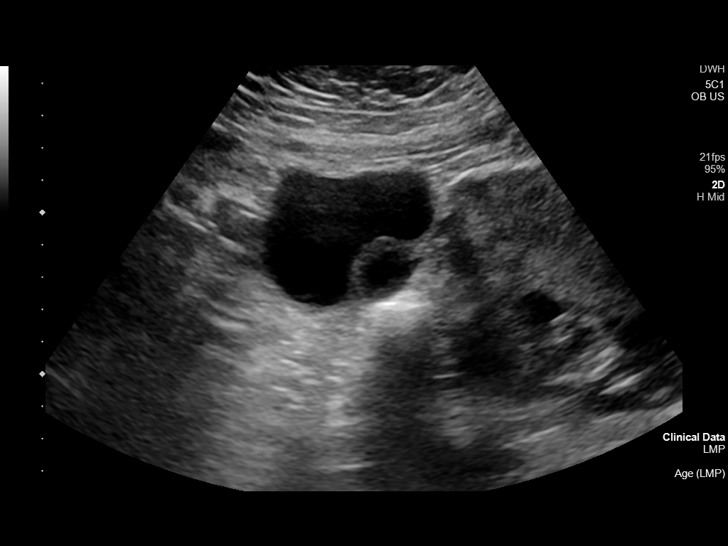
[im 53/75]
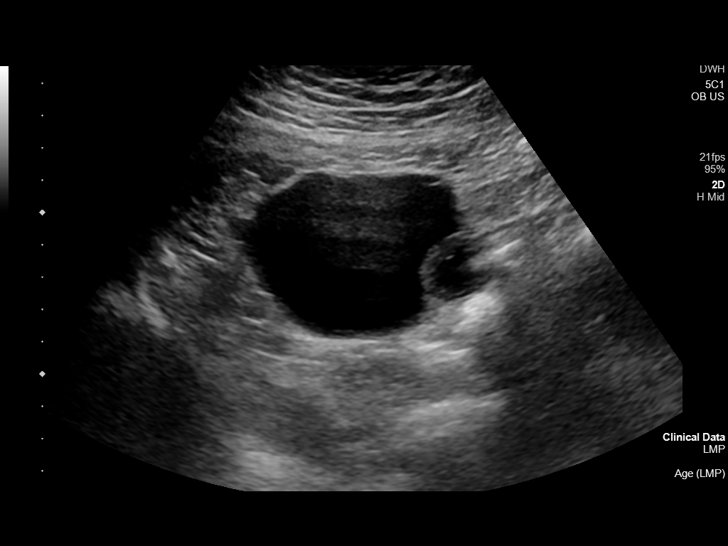
[im 58/75]
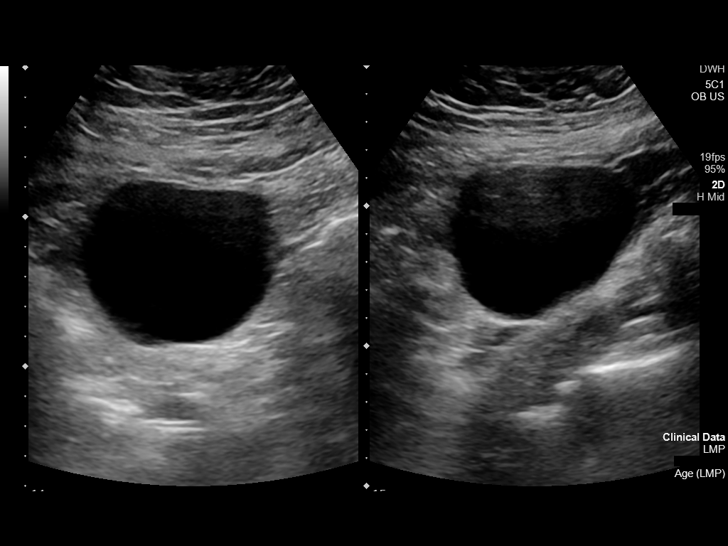
[im 64/75]
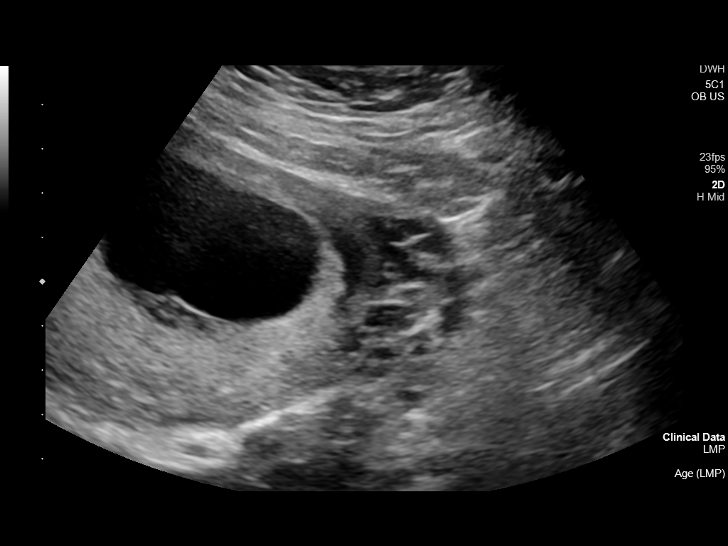
[im 69/75]
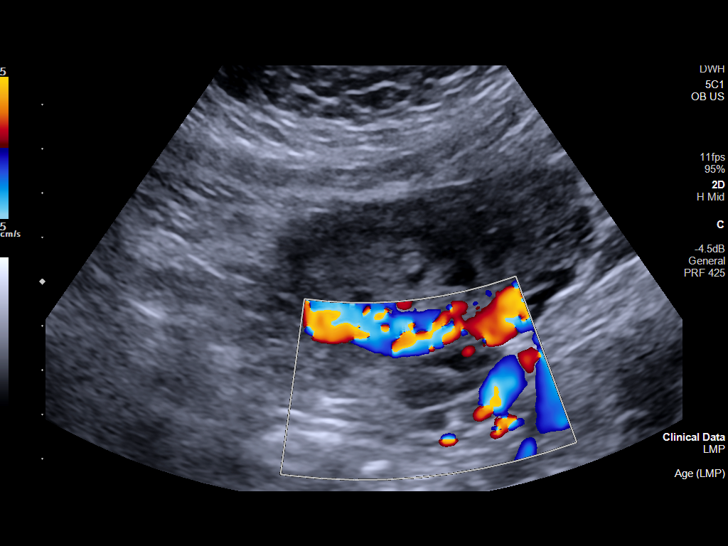
[im 75/75]
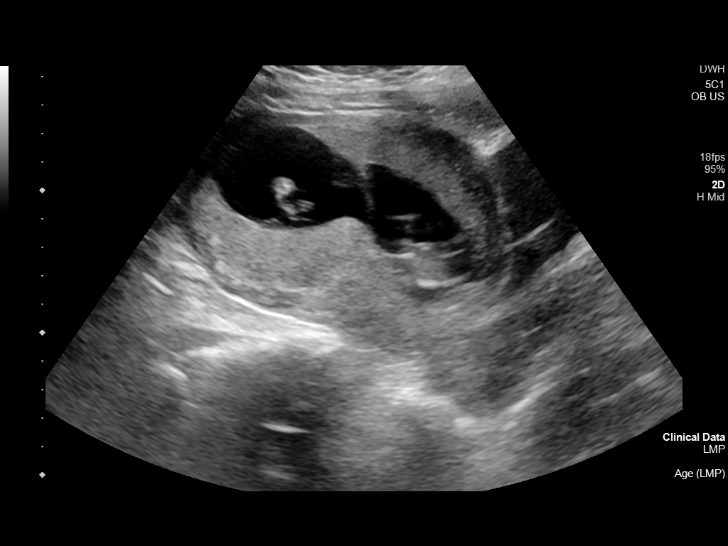

[14 of 28 positions shown; findings below may reference images not displayed]

FINDINGS: Number of IUPs:  2

Chorionicity/Amnionicity:  Dichorionic-diamniotic (thick membrane)

TWIN 1

Yolk sac:  Not visualized

Embryo:  Visualized

Cardiac Activity: Visualized

Heart Rate: 173 bpm

MSD:   mm    w     d

CRL:   50 mm   11 w 5 d                  US EDC: 11/25/2020

TWIN 2

Yolk sac:  None

Embryo:  Visualized

Cardiac Activity: Visualized

Heart Rate: 173 bpm

MSD:   Mm    w     d

CRL:   50 mm   11 w 5 d                  US EDC: 11/25/2020

Subchorionic hemorrhage:  Small subchorionic hemorrhage

Maternal uterus/adnexae: 7 cm cyst within the right ovary.
IMPRESSION: Twin intrauterine pregnancy, 11 weeks 5 days.

Small subchorionic hemorrhage.
